# Patient Record
Sex: Male | Born: 1970 | Race: Black or African American | Hispanic: No | Marital: Married | Smoking: Current every day smoker
Health system: Southern US, Community
[De-identification: ages and names within clinical notes are randomized; demographics above are authoritative.]

## PROBLEM LIST (undated history)

## (undated) DIAGNOSIS — M1990 Unspecified osteoarthritis, unspecified site: Secondary | ICD-10-CM

## (undated) DIAGNOSIS — G600 Hereditary motor and sensory neuropathy: Secondary | ICD-10-CM

## (undated) HISTORY — PX: KNEE SURGERY: SHX244

## (undated) HISTORY — DX: Hereditary motor and sensory neuropathy: G60.0

## (undated) HISTORY — DX: Unspecified osteoarthritis, unspecified site: M19.90

---

## 2000-12-08 ENCOUNTER — Emergency Department (HOSPITAL_COMMUNITY): Admission: EM | Admit: 2000-12-08 | Discharge: 2000-12-09 | Payer: Self-pay | Admitting: Emergency Medicine

## 2001-08-31 ENCOUNTER — Emergency Department (HOSPITAL_COMMUNITY): Admission: EM | Admit: 2001-08-31 | Discharge: 2001-08-31 | Payer: Self-pay | Admitting: Emergency Medicine

## 2001-09-29 ENCOUNTER — Emergency Department (HOSPITAL_COMMUNITY): Admission: EM | Admit: 2001-09-29 | Discharge: 2001-09-29 | Payer: Self-pay | Admitting: Emergency Medicine

## 2001-10-06 ENCOUNTER — Ambulatory Visit (HOSPITAL_BASED_OUTPATIENT_CLINIC_OR_DEPARTMENT_OTHER): Admission: RE | Admit: 2001-10-06 | Discharge: 2001-10-06 | Payer: Self-pay | Admitting: Orthopaedic Surgery

## 2001-11-24 ENCOUNTER — Encounter: Admission: RE | Admit: 2001-11-24 | Discharge: 2001-12-21 | Payer: Self-pay | Admitting: Orthopaedic Surgery

## 2002-01-05 ENCOUNTER — Ambulatory Visit (HOSPITAL_BASED_OUTPATIENT_CLINIC_OR_DEPARTMENT_OTHER): Admission: RE | Admit: 2002-01-05 | Discharge: 2002-01-05 | Payer: Self-pay | Admitting: Orthopaedic Surgery

## 2002-03-07 ENCOUNTER — Encounter: Admission: RE | Admit: 2002-03-07 | Discharge: 2002-04-05 | Payer: Self-pay | Admitting: Orthopaedic Surgery

## 2002-05-11 ENCOUNTER — Ambulatory Visit (HOSPITAL_BASED_OUTPATIENT_CLINIC_OR_DEPARTMENT_OTHER): Admission: RE | Admit: 2002-05-11 | Discharge: 2002-05-11 | Payer: Self-pay | Admitting: Orthopaedic Surgery

## 2002-08-17 ENCOUNTER — Ambulatory Visit (HOSPITAL_BASED_OUTPATIENT_CLINIC_OR_DEPARTMENT_OTHER): Admission: RE | Admit: 2002-08-17 | Discharge: 2002-08-17 | Payer: Self-pay | Admitting: Orthopaedic Surgery

## 2003-08-23 ENCOUNTER — Emergency Department (HOSPITAL_COMMUNITY): Admission: EM | Admit: 2003-08-23 | Discharge: 2003-08-24 | Payer: Self-pay | Admitting: Emergency Medicine

## 2006-07-02 ENCOUNTER — Ambulatory Visit (HOSPITAL_BASED_OUTPATIENT_CLINIC_OR_DEPARTMENT_OTHER): Admission: RE | Admit: 2006-07-02 | Discharge: 2006-07-02 | Payer: Self-pay | Admitting: Orthopedic Surgery

## 2012-06-21 DIAGNOSIS — F17200 Nicotine dependence, unspecified, uncomplicated: Secondary | ICD-10-CM | POA: Insufficient documentation

## 2016-01-06 DIAGNOSIS — Z9889 Other specified postprocedural states: Secondary | ICD-10-CM | POA: Insufficient documentation

## 2016-09-04 ENCOUNTER — Ambulatory Visit (INDEPENDENT_AMBULATORY_CARE_PROVIDER_SITE_OTHER): Payer: Self-pay | Admitting: Orthopaedic Surgery

## 2016-10-13 ENCOUNTER — Ambulatory Visit (INDEPENDENT_AMBULATORY_CARE_PROVIDER_SITE_OTHER): Payer: Self-pay | Admitting: Orthopaedic Surgery

## 2016-11-09 ENCOUNTER — Ambulatory Visit (INDEPENDENT_AMBULATORY_CARE_PROVIDER_SITE_OTHER): Payer: Medicare Other

## 2016-11-09 ENCOUNTER — Ambulatory Visit (INDEPENDENT_AMBULATORY_CARE_PROVIDER_SITE_OTHER): Payer: Self-pay | Admitting: Orthopaedic Surgery

## 2016-11-09 ENCOUNTER — Ambulatory Visit (INDEPENDENT_AMBULATORY_CARE_PROVIDER_SITE_OTHER): Payer: Medicare Other | Admitting: Orthopaedic Surgery

## 2016-11-09 ENCOUNTER — Encounter (INDEPENDENT_AMBULATORY_CARE_PROVIDER_SITE_OTHER): Payer: Self-pay | Admitting: Orthopaedic Surgery

## 2016-11-09 VITALS — BP 138/90 | HR 80 | Resp 14 | Ht 74.0 in | Wt 175.0 lb

## 2016-11-09 DIAGNOSIS — M25532 Pain in left wrist: Secondary | ICD-10-CM

## 2016-11-09 DIAGNOSIS — M79671 Pain in right foot: Secondary | ICD-10-CM

## 2016-11-09 DIAGNOSIS — M659 Synovitis and tenosynovitis, unspecified: Secondary | ICD-10-CM

## 2016-11-09 DIAGNOSIS — M65961 Unspecified synovitis and tenosynovitis, right lower leg: Secondary | ICD-10-CM

## 2016-11-09 MED ORDER — BUPIVACAINE HCL 0.5 % IJ SOLN
1.0000 mL | INTRAMUSCULAR | Status: AC | PRN
Start: 2016-11-09 — End: 2016-11-09
  Administered 2016-11-09: 1 mL via INTRA_ARTICULAR

## 2016-11-09 MED ORDER — LIDOCAINE HCL 1 % IJ SOLN
2.0000 mL | INTRAMUSCULAR | Status: AC | PRN
  Administered 2016-11-09: 2 mL

## 2016-11-09 MED ORDER — METHYLPREDNISOLONE ACETATE 40 MG/ML IJ SUSP
40.0000 mg | INTRAMUSCULAR | Status: AC | PRN
  Administered 2016-11-09: 40 mg via INTRA_ARTICULAR

## 2016-11-09 NOTE — Progress Notes (Signed)
Office Visit Note   Patient: Stephen Powell           Date of Birth: Mar 29, 1971           MRN: [id number] Visit Date: 11/09/2016              Requested by: No referring provider defined for this encounter. PCP: No primary care provider on file.   Assessment & Plan: Visit Diagnoses:  1. Pain in right foot   2. Pain in left wrist   3. Synovitis of right knee   Synovitis right knee by MRI scan, recurrent injury left wrist by virtue of falls with probable ligamentous injury scapholunate joint. 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 No specific treatment to her right knee at present. Patient has been fitted for braces and  shoe inserts bilaterally and will follow up with his primary care physician. We'll see back on a when necessary basis. No evidence of a problem with the screw insertion the left great toe  Follow-Up Instructions: Return if symptoms worsen or fail to improve.   Orders:  Orders Placed This Encounter  Procedures  . XR Wrist Complete Left  . XR Foot Complete Right   No orders of the defined types were placed in this encounter.     Procedures: Medium Joint Inj Date/Time: 11/09/2016 12:05 PM Performed by: Valeria BatmanWHITFIELD, Stephen Powell Authorized by: Valeria BatmanWHITFIELD, Stephen Powell   Consent Given by:  Patient Indications:  Pain and joint swelling Location:  Wrist Site:  L radiocarpal Needle Size:  27 G Ultrasound Guided: No   Fluoroscopic Guidance: No   Medications:  2 mL lidocaine 1 %; 1 mL bupivacaine 0.5 %; 40 mg methylPREDNISolone acetate 40 MG/ML     Clinical Data: No additional findings.   Subjective: Chief Complaint  Patient presents with  . Right Knee - Results    MRI results of R knee  . Left Wrist - Pain    Pt relates she has fallen so many time his Left wrist is very painful  Mr. Stephen Powell was seen recently for evaluation of recurrent swelling of his right knee. I obtained an MRI scan  demonstrating intact medial lateral menisci without evidence of a tear. Cruciate and collateral ligaments were unremarkable. There was some mild chondromalacia of the lateral compartment and masslike regions of synovitis centrally within the joint which may have been causing his swelling. Presently he is asymptomatic. He's had prior fusion of the IP joint of his left great toe with a single screw and feels like it may be "sticking out of the skin "He's not had any recent injury or trauma to his great toe. He does have history of Charcot Stephen Powell tooth disease and is being followed by his primary care physician. He does take gabapentin. He realizes that he has an awkward gait and has been recently fitted for either new orthotics or braces to both of his lower extremities. He also relates that he's had some recent falls because of his Charcot-Marie-Tooth disease and frequently lands on his outstretched left wrist. He's been having some pain and swelling directly over the dorsum of his wrist without numbness or tingling. He had some films several months ago that were "negative" but still is concerned that he's having  HPI  Review of Systems  Constitutional: Negative for fatigue.  HENT: Negative for hearing loss.   Respiratory: Negative for apnea, chest tightness and shortness of breath.  Cardiovascular: Negative for chest pain, palpitations and leg swelling.  Gastrointestinal: Negative for blood in stool, constipation and diarrhea.  Genitourinary: Negative for difficulty urinating.  Musculoskeletal: Positive for back pain, joint swelling and neck pain. Negative for arthralgias, myalgias and neck stiffness.  Neurological: Positive for numbness. Negative for weakness and headaches.  Hematological: Does not bruise/bleed easily.  Psychiatric/Behavioral: Negative for sleep disturbance. The patient is not nervous/anxious.      Objective: Vital Signs: BP 138/90   Pulse 80   Resp 14   Ht 6\' 2"  (1.88 m)    Wt 175 lb (79.4 kg)   BMI 22.47 kg/m   Physical Exam  Ortho Exam right knee without effusion. No localized areas of tenderness. No crepitation with patella motion. No instability. No calf pain. Does have high arches in both feet bilaterally consistent with a Charcot-Marie-Tooth disease. He is developing some early claw toeing bilaterally but no evidence of any callus formation except at the tip of his left great toe no prominence of a prior screw. Appears to be good fusion across the IP joint. Risks and swelling of the dorsum of his left wrist with some local tenderness at the midpoint of the radiocarpal joint pain with range of motion of the wrist. No evidence of carpal tunnel syndrome. Good grip and good release.  Specialty Comments:  No specialty comments available.  Imaging: Xr Wrist Complete Left  Result Date: 11/09/2016 Films of the left wrist for pain several projections. There is some decreased space at the radiocarpal joint radially. Mild degenerative changes of the base of the thumb. No evidence of fracture. Slight widening between the lunate and navicular  Xr Foot Complete Right  Result Date: 11/09/2016 X-rays of the right foot demonstrate a threaded screw across the IP joint of the great toe in excellent position. There is no evidence of extrusion area no evidence of fracture. Patient was concerned that the screw may be extruding through the tip of his toe    PMFS History: There are no active problems to display for this patient.  Past Medical History:  Diagnosis Date  . Arthritis   . Charcot Marie Tooth muscular atrophy     Family History  Problem Relation Age of Onset  . Diabetes Mother     Past Surgical History:  Procedure Laterality Date  . KNEE SURGERY     Social History   Occupational History  . Not on file.   Social History Main Topics  . Smoking status: Current Every Day Smoker    Types: Cigarettes  . Smokeless tobacco: Never Used  . Alcohol use 0.6  oz/week    1 Glasses of wine per week  . Drug use: No  . Sexual activity: Not on file

## 2017-02-23 ENCOUNTER — Other Ambulatory Visit: Payer: Self-pay | Admitting: Pain Medicine

## 2017-02-23 DIAGNOSIS — M545 Low back pain: Secondary | ICD-10-CM

## 2017-04-13 ENCOUNTER — Ambulatory Visit
Admission: RE | Admit: 2017-04-13 | Discharge: 2017-04-13 | Disposition: A | Discharge status: Home / Self Care | Payer: Medicare Other | Source: Ambulatory Visit | Attending: Pain Medicine | Admitting: Pain Medicine

## 2017-04-13 ENCOUNTER — Other Ambulatory Visit: Payer: Self-pay

## 2017-04-13 DIAGNOSIS — M545 Low back pain: Secondary | ICD-10-CM

## 2017-06-07 ENCOUNTER — Other Ambulatory Visit: Payer: Self-pay | Admitting: Pain Medicine

## 2017-06-07 DIAGNOSIS — R200 Anesthesia of skin: Secondary | ICD-10-CM

## 2017-06-07 DIAGNOSIS — M542 Cervicalgia: Secondary | ICD-10-CM

## 2017-11-24 ENCOUNTER — Encounter: Payer: Self-pay | Admitting: Sports Medicine

## 2017-11-24 ENCOUNTER — Other Ambulatory Visit: Payer: Self-pay

## 2017-11-24 ENCOUNTER — Ambulatory Visit (INDEPENDENT_AMBULATORY_CARE_PROVIDER_SITE_OTHER): Payer: Medicare Other | Admitting: Sports Medicine

## 2017-11-24 ENCOUNTER — Other Ambulatory Visit: Payer: Self-pay | Admitting: Sports Medicine

## 2017-11-24 ENCOUNTER — Ambulatory Visit (INDEPENDENT_AMBULATORY_CARE_PROVIDER_SITE_OTHER): Payer: Medicare Other

## 2017-11-24 DIAGNOSIS — G600 Hereditary motor and sensory neuropathy: Secondary | ICD-10-CM

## 2017-11-24 DIAGNOSIS — M6240 Contracture of muscle, unspecified site: Secondary | ICD-10-CM | POA: Diagnosis not present

## 2017-11-24 DIAGNOSIS — M2041 Other hammer toe(s) (acquired), right foot: Secondary | ICD-10-CM

## 2017-11-24 DIAGNOSIS — M79675 Pain in left toe(s): Secondary | ICD-10-CM

## 2017-11-24 DIAGNOSIS — G894 Chronic pain syndrome: Secondary | ICD-10-CM

## 2017-11-24 DIAGNOSIS — M2042 Other hammer toe(s) (acquired), left foot: Secondary | ICD-10-CM

## 2017-11-24 DIAGNOSIS — B351 Tinea unguium: Secondary | ICD-10-CM

## 2017-11-24 DIAGNOSIS — M79674 Pain in right toe(s): Secondary | ICD-10-CM

## 2017-11-24 DIAGNOSIS — M1220 Villonodular synovitis (pigmented), unspecified site: Secondary | ICD-10-CM

## 2017-11-24 DIAGNOSIS — M5030 Other cervical disc degeneration, unspecified cervical region: Secondary | ICD-10-CM | POA: Insufficient documentation

## 2017-11-24 NOTE — Progress Notes (Addendum)
Subjective: Stephen Powell is a 47 y.o. male patient who presents to office for evaluation of bilateral foot pain. Patient complains of progressive pain especially over the last year at the 2-5 toes. Ranks pain 10/10 and is now interferring with daily activities especially walking reports that the pain is sharp. Patient has tried no treatment had previous surgery early 2000's on the big toes and does admit to issue with his gait and states that his foot points downward and his toes rub and he gets painful calluses on the tips.  Patient also states that he would like for his toenails to be trimmed at this visit denies a history of diabetes and is not on any blood thinners.  Patient has a history of Charcot-Marie-Tooth disease chronic pain secondary to back and gait issues.  Patient denies any other pedal complaints.   Review of Systems  Musculoskeletal: Positive for back pain, falls, joint pain and myalgias.  Neurological: Positive for focal weakness.  All other systems reviewed and are negative.    Patient Active Problem List   Diagnosis Date Noted  . Charcot-Marie-Tooth disease 11/24/2017  . Chronic pain syndrome 11/24/2017  . Degenerative disc disease, cervical 11/24/2017  . 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 S/P right knee arthroscopy 01/06/2016  . Tobacco use disorder 06/21/2012    Current Outpatient Medications on File Prior to Visit  Medication Sig Dispense Refill  . gabapentin (NEURONTIN) 400 MG capsule Take by mouth.    . oxyCODONE-acetaminophen (PERCOCET) 10-325 MG tablet Take 1 tablet by mouth every 4 (four) hours as needed for pain.     No current facility-administered medications on file prior to visit.     Allergies  Allergen Reactions  . Hydrocodone-Acetaminophen Nausea And Vomiting    Objective:  General: Alert and oriented x3 in no acute distress  Dermatology: Minimal hyperkeratotic lesion overlying 2-5 PIPJ dorsally bilateral. No open lesions  bilateral lower extremities, no webspace macerations, no ecchymosis bilateral, all nails x 10 are mildly elongated and thick with mild incurvation without any signs of infection bilateral hallux.  Vascular: Dorsalis Pedis 2/4 and Posterior Tibial pedal pulses 1/4, Capillary Fill Time 3 seconds,(+) pedal hair growth bilateral, no edema bilateral lower extremities, Temperature gradient within normal limits.  Neurology: Gross sensation intact via light touch bilateral, protective and vibratory sensation diminished due to CMT.  Musculoskeletal: Rigid hammertoes 1-5 with Mild tenderness with palpation at PIPJ at the lesser toes on both feet.  Foot drop and stork-leg appearance bilateral.  Strength 4 out of 5 bilateral  Gait: Unassisted in boots  Xrays  Right/Left Foot    Impression: 1 through 5 digital contractures, hardware intact to first toes bilateral history of previous arthrodesis at IPJ no other acute findings.       Assessment and Plan: Problem List Items Addressed This Visit      Nervous and Auditory   Charcot-Marie-Tooth disease     Musculoskeletal and Integument   PVNS (pigmented villonodular synovitis)     Other   Chronic pain syndrome    Other Visit Diagnoses    Pain in toes of both feet    -  Primary   Relevant Orders   DG Foot Complete Right   DG Foot Complete Left   Hammer toes of both feet       Contracture of tendon sheath       Pain due to onychomycosis of toenails of both feet          -Complete  examination performed -Xrays reviewed -Discussed treatement options for hammertoe secondary to CMT -Recommend good supportive shoes and advised patient that he will benefit from custom bracing and surgery in the future.  Patient will like to wait until after the has went to court for his violation of his probation -I also advised patient to his to his chronic pain I will have to talk to the doctor who is giving him his pain medicine prior to surgery to see what pain  regiment we should have patient on for this procedure -Discussed treatment options for toenails.  ABN signed.  Mechanically debrided nails x10 using a sterile nail nipper without incident -Patient to return to office when ready for surgery or sooner if condition worsens.  Asencion Islam, DPM

## 2019-02-13 IMAGING — MR MR LUMBAR SPINE W/O CM
4 of 6 series · 20 of 48 positions shown · non-contrast
Comparison: None.

CLINICAL DATA: Chronic low back pain, left foot drop, no bowel or
bladder changes

EXAM:
MRI LUMBAR SPINE WITHOUT CONTRAST
TECHNIQUE: Multiplanar, multisequence MR imaging of the lumbar spine was
performed. No intravenous contrast was administered.

[Series 5: T2 · sagittal · 4.0mm · 0.73mm/px · 6 of 17 slices shown (1 of 2)]
[im 1/17]
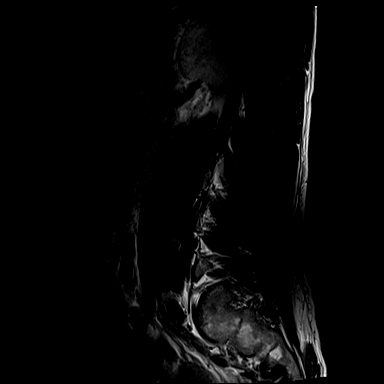
[im 4/17]
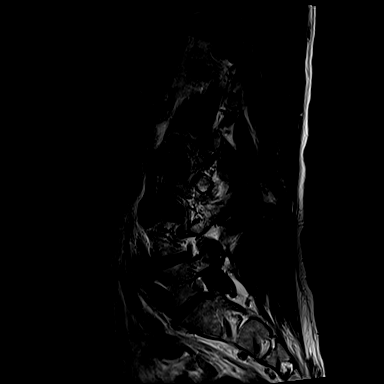
[im 7/17]
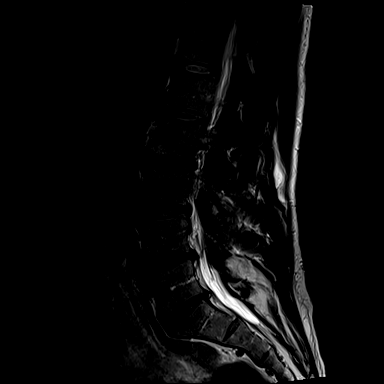
[im 10/17]
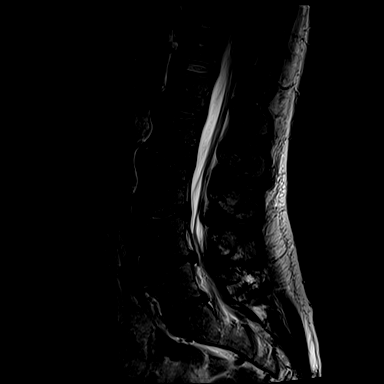
[im 13/17]
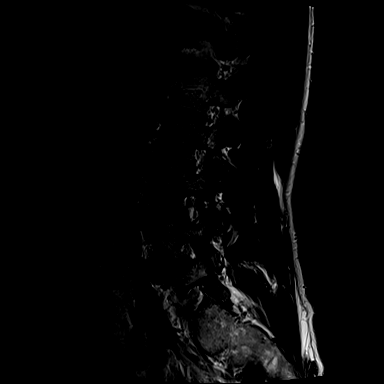
[im 17/17]
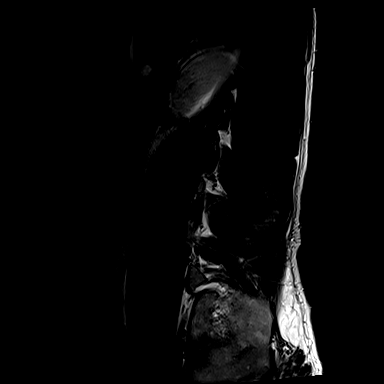

[Series 6: T1 · sagittal · 4.0mm · 0.73mm/px · 3 of 17 slices shown (1 of 2)]
[im 4/17]
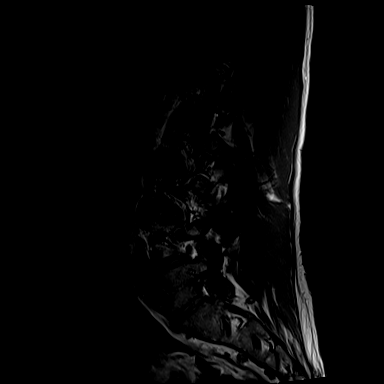
[im 10/17]
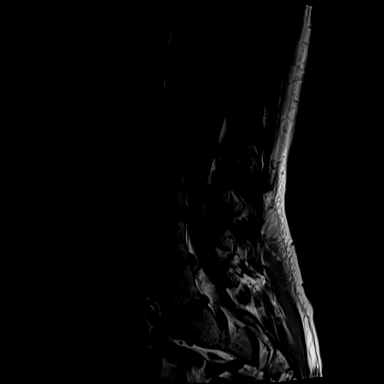
[im 17/17]
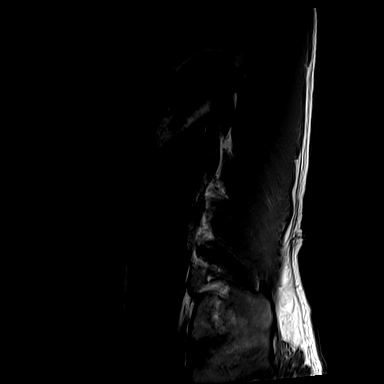

[Series 8: T1 · sagittal · 4.0mm · 0.73mm/px · 3 of 17 slices shown (2 of 2)]
[im 1/17]
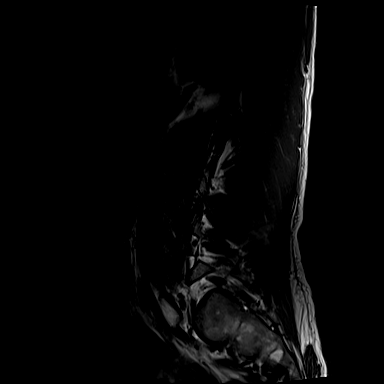
[im 9/17]
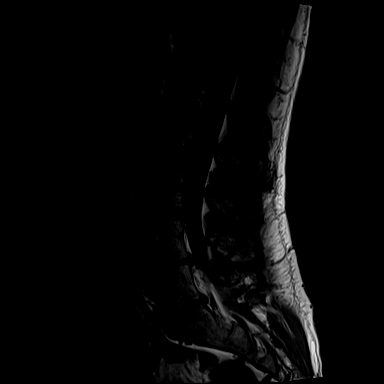
[im 17/17]
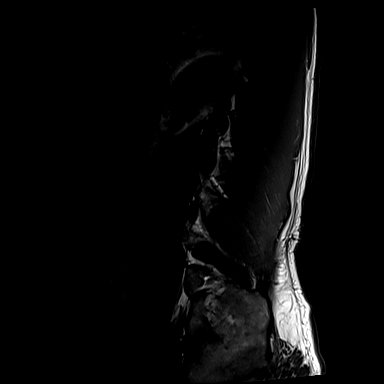

[Series 11: T2 · axial · 4.0mm · 0.28mm/px · z∈[-97,+99]mm · 8 of 42 slices shown (2 of 2)]
[im 1/42]
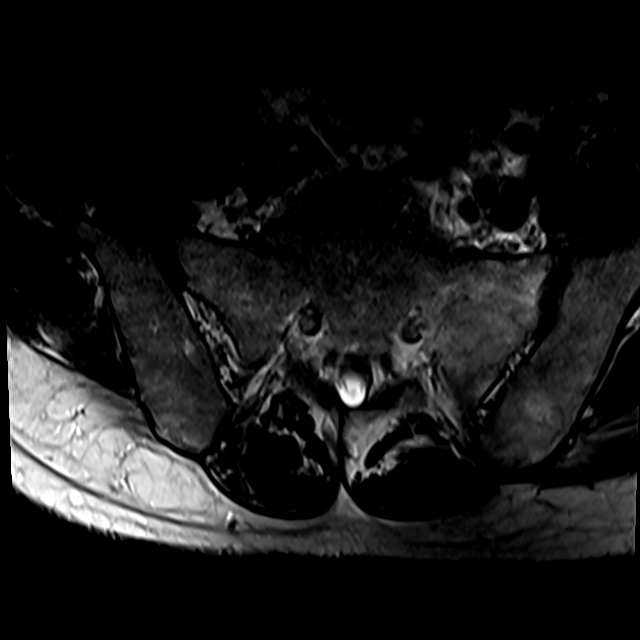
[im 7/42]
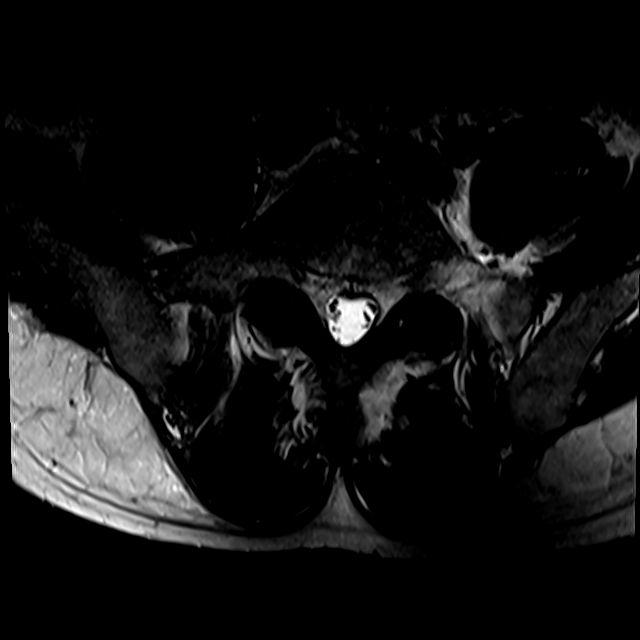
[im 14/42]
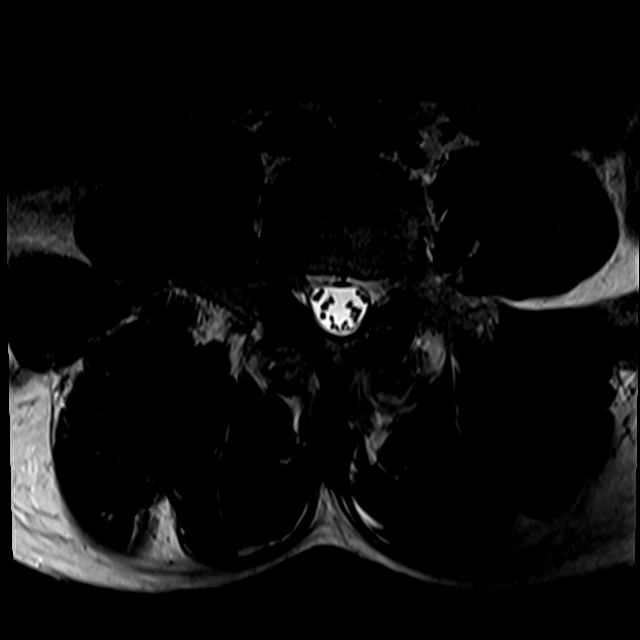
[im 18/42]
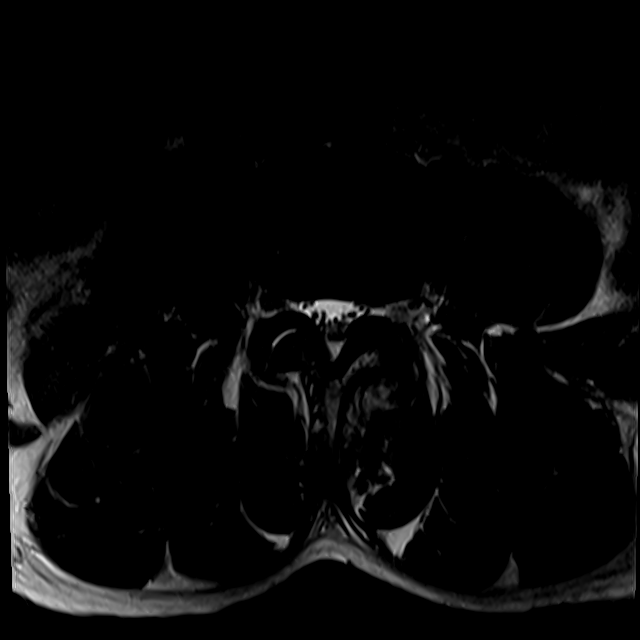
[im 21/42]
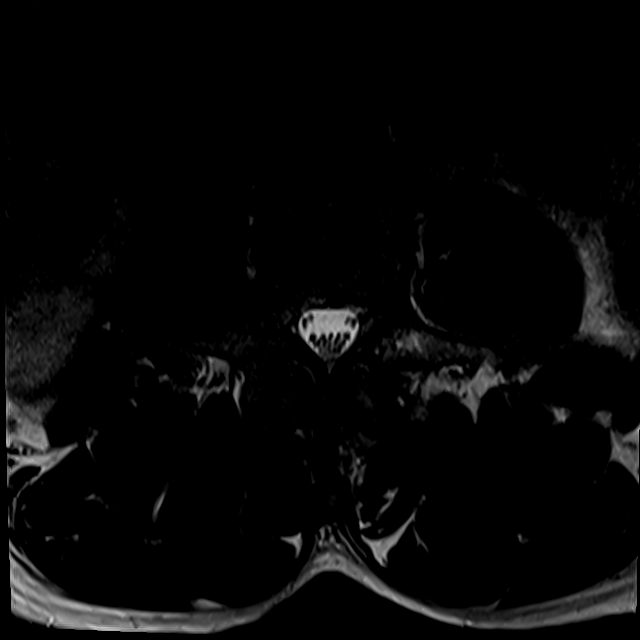
[im 24/42]
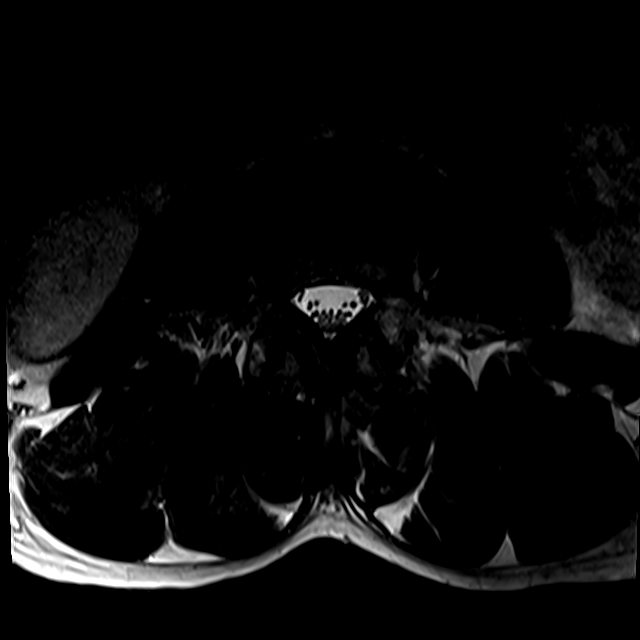
[im 28/42]
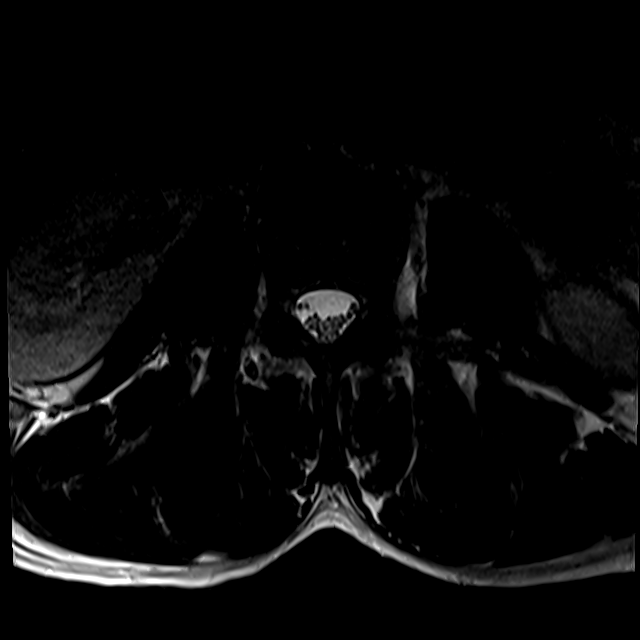
[im 35/42]
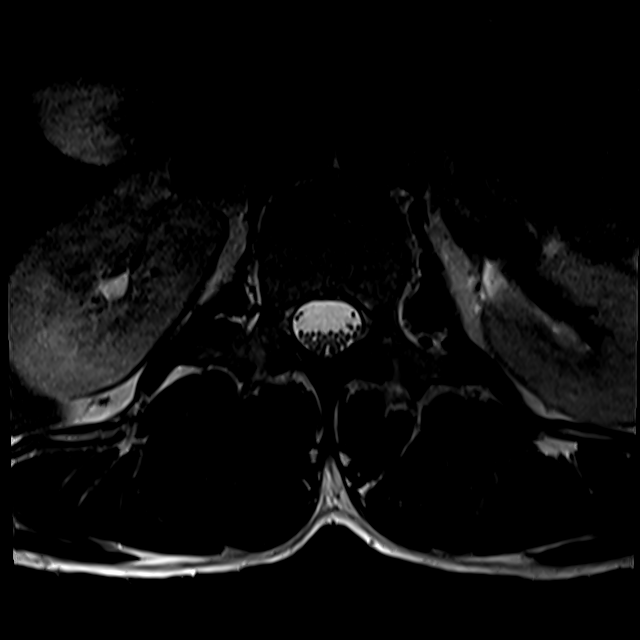

[20 of 48 positions shown; findings below may reference images not displayed]

FINDINGS: Segmentation:  Standard.

Alignment:  Physiologic.

Vertebrae:  No fracture, evidence of discitis, or bone lesion.

Conus medullaris and cauda equina: Conus extends to the T12 level.
Conus and cauda equina appear normal.

Paraspinal and other soft tissues: No focal paraspinal abnormality.

Disc levels:

Disc spaces: Degenerative disc disease with disc desiccation at
L4-5. Degenerative disc disease with disc desiccation and mild disc
height loss at L5-S1.

T12-L1: No significant disc bulge. No evidence of neural foraminal
stenosis. No central canal stenosis.

L1-L2: No significant disc bulge. No evidence of neural foraminal
stenosis. No central canal stenosis.

L2-L3: Mild broad-based disc bulge. No evidence of neural foraminal
stenosis. No central canal stenosis.

L3-L4: Mild broad-based disc bulge. Moderate bilateral facet
arthropathy. Bilateral lateral recess narrowing. No evidence of
neural foraminal stenosis. No central canal stenosis.

L4-L5: Broad-based disc bulge with a central annular fissure. Mild
bilateral facet arthropathy. Bilateral lateral recess narrowing.
Mild right foraminal stenosis. No left foraminal stenosis. No
central canal stenosis.

L5-S1: Mild broad-based disc bulge. No evidence of neural foraminal
stenosis. No central canal stenosis.
IMPRESSION: 1. At L4-5 there is a broad-based disc bulge with a central annular
fissure. Mild bilateral facet arthropathy. Bilateral lateral recess
narrowing. Mild right foraminal stenosis.
2. At L3-4 there is a mild broad-based disc bulge. Moderate
bilateral facet arthropathy and bilateral lateral recess narrowing.
